# Patient Record
Sex: Male | Born: 1985 | Race: Black or African American | Hispanic: No | Marital: Single | Smoking: Never smoker
Health system: Southern US, Community
[De-identification: ages and names within clinical notes are randomized; demographics above are authoritative.]

---

## 2016-03-02 ENCOUNTER — Encounter (HOSPITAL_COMMUNITY): Payer: Self-pay

## 2016-03-02 ENCOUNTER — Emergency Department (HOSPITAL_COMMUNITY)
Admission: EM | Admit: 2016-03-02 | Discharge: 2016-03-02 | Disposition: A | Discharge status: Home / Self Care | Payer: Self-pay | Attending: Emergency Medicine | Admitting: Emergency Medicine

## 2016-03-02 DIAGNOSIS — R55 Syncope and collapse: Secondary | ICD-10-CM | POA: Insufficient documentation

## 2016-03-02 LAB — CBC WITH DIFFERENTIAL/PLATELET
Basophils Absolute: 0 10*3/uL (ref 0.0–0.1)
Basophils Relative: 0 %
Eosinophils Absolute: 0.1 10*3/uL (ref 0.0–0.7)
Eosinophils Relative: 1 %
HCT: 43.1 % (ref 39.0–52.0)
Hemoglobin: 15.2 g/dL (ref 13.0–17.0)
Lymphocytes Relative: 32 %
Lymphs Abs: 2.4 10*3/uL (ref 0.7–4.0)
MCH: 31 pg (ref 26.0–34.0)
MCHC: 35.3 g/dL (ref 30.0–36.0)
MCV: 88 fL (ref 78.0–100.0)
Monocytes Absolute: 0.4 10*3/uL (ref 0.1–1.0)
Monocytes Relative: 5 %
Neutro Abs: 4.8 10*3/uL (ref 1.7–7.7)
Neutrophils Relative %: 62 %
Platelets: 226 10*3/uL (ref 150–400)
RBC: 4.9 MIL/uL (ref 4.22–5.81)
RDW: 12.2 % (ref 11.5–15.5)
WBC: 7.7 10*3/uL (ref 4.0–10.5)

## 2016-03-02 LAB — BASIC METABOLIC PANEL
Anion gap: 12 (ref 5–15)
BUN: 10 mg/dL (ref 6–20)
CO2: 26 mmol/L (ref 22–32)
Calcium: 9.6 mg/dL (ref 8.9–10.3)
Chloride: 99 mmol/L — ABNORMAL LOW (ref 101–111)
Creatinine, Ser: 1.34 mg/dL — ABNORMAL HIGH (ref 0.61–1.24)
GFR calc Af Amer: >60 mL/min (ref >60)
GFR calc non Af Amer: >60 mL/min (ref >60)
Glucose, Bld: 122 mg/dL — ABNORMAL HIGH (ref 65–99)
Potassium: 3.8 mmol/L (ref 3.5–5.1)
Sodium: 137 mmol/L (ref 135–145)

## 2016-03-02 LAB — D-DIMER, QUANTITATIVE (NOT AT ARMC): D-Dimer, Quant: 0.32 ug/mL-FEU (ref 0.00–0.50)

## 2016-03-02 MED ORDER — SODIUM CHLORIDE 0.9 % IV BOLUS (SEPSIS)
1000.0000 mL | Freq: Once | INTRAVENOUS | Status: AC
  Administered 2016-03-02: 1000 mL via INTRAVENOUS

## 2016-03-02 NOTE — ED Provider Notes (Signed)
[facility]-EMERGENCY DEPT Provider Note   CSN: [id number] Arrival date & time: 03/02/16  2105     History   Chief Complaint Chief Complaint  Patient presents with  . Loss of Consciousness    HPI Tyler Norman is a 31 y.o. male who is previously healthy who presents following near syncopal episode after eating dinner this evening. Patient states he was at [employer] eating some vegetables and then grilled catfish followed by fried catfish when he began feeling hot, sweating, and lightheaded. Patient states he put his head on the table until EMS arrived. He never passed out. He denies any abdominal pain, nausea, vomiting at any point. He denies any chest pain, shortness of breath, current lightheadedness or headache, urinary symptoms. Patient states he was up around 6 AM this morning and only got around 3 hours of sleep last night. He drove to [place] and back and was in the car around 8 hours total. He denies any new leg pain or swelling. He does not smoke. Patient notes he has drank very little fluid today.  HPI  History reviewed. No pertinent past medical history.  There are no active problems to display for this patient.   History reviewed. No pertinent surgical history.     Home Medications    Prior to Admission medications   Medication Sig Start Date End Date Taking? Authorizing Provider  loratadine (CLARITIN) 10 MG tablet Take 10 mg by mouth daily as needed for allergies.   Yes Historical Provider, MD    Family History No family history on file.  Social History Social History  Substance Use Topics  . Smoking status: Never Smoker  . Smokeless tobacco: Never Used  . Alcohol use Yes     Comment: "every blue moon"     Allergies   Patient has no allergy information on record.   Review of Systems Review of Systems  Constitutional: Positive for diaphoresis and fatigue. Negative for chills and fever.  HENT: Negative for facial swelling and sore throat.     Respiratory: Negative for shortness of breath.   Cardiovascular: Negative for chest pain.  Gastrointestinal: Negative for abdominal pain, nausea and vomiting.  Genitourinary: Negative for dysuria.  Musculoskeletal: Negative for back pain.  Skin: Negative for rash and wound.  Neurological: Positive for light-headedness. Negative for headaches.  Psychiatric/Behavioral: The patient is not nervous/anxious.      Physical Exam Updated Vital Signs BP 128/79   Pulse 82   Temp 98.2 F (36.8 C) (Oral)   Resp 16   Ht 5\' 9"  (1.753 m)   Wt 78 kg   SpO2 99%   BMI 25.40 kg/m   Physical Exam  Constitutional: He appears well-developed and well-nourished. No distress.  HENT:  Head: Normocephalic and atraumatic.  Mouth/Throat: Oropharynx is clear and moist. No oropharyngeal exudate.  Eyes: Conjunctivae and EOM are normal. Pupils are equal, round, and reactive to light. Right eye exhibits no discharge. Left eye exhibits no discharge. No scleral icterus.  Neck: Normal range of motion. Neck supple. No thyromegaly present.  Cardiovascular: Normal rate, regular rhythm, normal heart sounds and intact distal pulses.  Exam reveals no gallop and no friction rub.   No murmur heard. Pulmonary/Chest: Effort normal and breath sounds normal. No stridor. No respiratory distress. He has no wheezes. He has no rales.  Abdominal: Soft. Bowel sounds are normal. He exhibits no distension. There is no tenderness. There is no rebound and no guarding.  Musculoskeletal: He exhibits no edema.  No calf  TTP bilaterally  Lymphadenopathy:    He has no cervical adenopathy.  Neurological: He is alert. Coordination normal.  CN 3-12 intact; normal sensation throughout; 5/5 strength in all 4 extremities; equal bilateral grip strength; no ataxia on finger to nose  Skin: Skin is warm and dry. No rash noted. He is not diaphoretic. No pallor.  Psychiatric: He has a normal mood and affect.  Nursing note and vitals  reviewed.    ED Treatments / Results  Labs (all labs ordered are listed, but only abnormal results are displayed) Labs Reviewed  BASIC METABOLIC PANEL - Abnormal; Notable for the following:       Result Value   Chloride 99 (*)    Glucose, Bld 122 (*)    Creatinine, Ser 1.34 (*)    All other components within normal limits  CBC WITH DIFFERENTIAL/PLATELET  D-DIMER, QUANTITATIVE (NOT AT [facility])    EKG  EKG Interpretation  Date/Time:  Thursday March 02 2016 21:21:44 EST Ventricular Rate:  79 PR Interval:    QRS Duration: 97 QT Interval:  369 QTC Calculation: 423 R Axis:   75 Text Interpretation:  Sinus rhythm ST elev, probable normal early repol pattern Confirmed by Rubin PayorPICKERING  MD, Harrold DonathNATHAN [phone]([id number]) on 03/02/2016 10:29:38 PM       Radiology No results found.  Procedures Procedures (including critical care time)  Medications Ordered in ED Medications  sodium chloride 0.9 % bolus 1,000 mL (1,000 mLs Intravenous New Bag/Given 03/02/16 2152)     Initial Impression / Assessment and Plan / ED Course  I have reviewed the triage vital signs and the nursing notes.  Pertinent labs & imaging results that were available during my care of the patient were reviewed by me and considered in my medical decision making (see chart for details).     Patient feeling better after fluids in the ED. CBC unremarkable. BMP shows 99 chloride, glucose 122, creatinine 1.34. D-dimer 0.32. EKG shows NSR. Probable dehydration or vasovagal. Patient given a work note to rest tomorrow and advised to drink plan fluids. Strict precautions given. Patient advised to follow-up to establish care with a primary care provider. Patient understands and agrees with plan. Patient vitals stable throughout the course and discharged in satisfactory condition. I discussed patient's case with Dr. Rubin PayorPickering who guided the patient's management and agrees with plan.  Final Clinical Impressions(s) / ED Diagnoses   Final  diagnoses:  Near syncope    New Prescriptions New Prescriptions   No medications on file     Emi Holeslexandra M Reeta Kuk, Cordelia Poche-C 03/02/16 2312    Benjiman CoreNathan Pickering, MD 03/03/16 0002

## 2016-03-02 NOTE — ED Triage Notes (Signed)
Pt BIB [facility] from [employer] where pt was finishing eating and had an episode of extreme tiredness. EMS reports that the call was for someone unconscious. Upon arrival EMS reports pt was grey from shoulders up but had good color shoulders down. Pt reports only having about 3 hours of sleep last night and long car ride today. Denies ETOH, or any other substances. Presents A&OX4

## 2016-03-02 NOTE — Discharge Instructions (Signed)
Make sure to drink plenty of fluids. Please follow-up and establish care with a primary care provider by calling the number circled on your discharge paperwork. Please return to the emergency department if you develop any new or worsening symptoms.

## 2017-10-28 ENCOUNTER — Other Ambulatory Visit: Payer: Self-pay

## 2017-10-28 ENCOUNTER — Emergency Department (HOSPITAL_COMMUNITY): Payer: Self-pay

## 2017-10-28 ENCOUNTER — Emergency Department (HOSPITAL_COMMUNITY)
Admission: EM | Admit: 2017-10-28 | Discharge: 2017-10-29 | Disposition: A | Discharge status: Home / Self Care | Payer: Self-pay | Attending: Emergency Medicine | Admitting: Emergency Medicine

## 2017-10-28 ENCOUNTER — Encounter (HOSPITAL_COMMUNITY): Payer: Self-pay | Admitting: Emergency Medicine

## 2017-10-28 DIAGNOSIS — R079 Chest pain, unspecified: Secondary | ICD-10-CM | POA: Insufficient documentation

## 2017-10-28 DIAGNOSIS — Z79899 Other long term (current) drug therapy: Secondary | ICD-10-CM | POA: Insufficient documentation

## 2017-10-28 LAB — CBC
HEMATOCRIT: 44 % (ref 39.0–52.0)
Hemoglobin: 15.3 g/dL (ref 13.0–17.0)
MCH: 31.1 pg (ref 26.0–34.0)
MCHC: 34.8 g/dL (ref 30.0–36.0)
MCV: 89.4 fL (ref 80.0–100.0)
Platelets: 278 10*3/uL (ref 150–400)
RBC: 4.92 MIL/uL (ref 4.22–5.81)
RDW: 11.8 % (ref 11.5–15.5)
WBC: 6.7 10*3/uL (ref 4.0–10.5)
nRBC: 0 % (ref 0.0–0.2)

## 2017-10-28 LAB — BASIC METABOLIC PANEL
ANION GAP: 7 (ref 5–15)
BUN: 11 mg/dL (ref 6–20)
CALCIUM: 9.3 mg/dL (ref 8.9–10.3)
CO2: 26 mmol/L (ref 22–32)
Chloride: 104 mmol/L (ref 98–111)
Creatinine, Ser: 1.24 mg/dL (ref 0.61–1.24)
GFR calc Af Amer: >60 mL/min (ref >60)
Glucose, Bld: 125 mg/dL — ABNORMAL HIGH (ref 70–99)
POTASSIUM: 3.6 mmol/L (ref 3.5–5.1)
Sodium: 137 mmol/L (ref 135–145)

## 2017-10-28 LAB — I-STAT TROPONIN, ED: TROPONIN I, POC: 0 ng/mL (ref 0.00–0.08)

## 2017-10-28 NOTE — ED Triage Notes (Signed)
Pt states he was at a restaurant and started having 10/10 L sided chest pain, feeling faint, generalized weakness, and diaphoresis that started 10 min after eating a steak dinner.  Denies pain at present.  Denies SOB.

## 2017-10-29 LAB — I-STAT TROPONIN, ED: TROPONIN I, POC: 0 ng/mL (ref 0.00–0.08)

## 2017-10-29 NOTE — ED Provider Notes (Signed)
[facility] EMERGENCY DEPARTMENT Provider Note   CSN: [id number] Arrival date & time: 10/28/17  2220     History   Chief Complaint Chief Complaint  Patient presents with  . Chest Pain    HPI Tyler Norman is a 32 y.o. male.  The history is provided by the patient and medical records. No language interpreter was used.   Tyler Norman is an otherwise healthy 32 y.o. male who presents to the emergency department today for acute onset of central chest pain across the entire chest wall which began about 10:00 PM tonight.  He states that he went out for dinner and shortly after leaving, his pain developed.  Denies any exertional component.  Associated with sudden sweating. He reports similar pain with a sudden sweating back in March 2018 where he came to the ER and they told him this was likely secondary to something that he ate.  He never had any shortness of breath, abdominal pain, nausea, vomiting, back pain.  No medications taken prior to arrival for symptoms.  He states that the pain lasted 10 to 15 minutes and then resolved.  He has not experienced any pain or other symptoms since the initial onset of his symptoms.  He currently is symptom-free and without complaints.   History reviewed. No pertinent past medical history.  There are no active problems to display for this patient.   History reviewed. No pertinent surgical history.      Home Medications    Prior to Admission medications   Medication Sig Start Date End Date Taking? Authorizing Provider  loratadine (CLARITIN) 10 MG tablet Take 10 mg by mouth daily as needed for allergies.    [provider]    Family History No family history on file.  Social History Social History   Tobacco Use  . Smoking status: Never Smoker  . Smokeless tobacco: Never Used  Substance Use Topics  . Alcohol use: Yes    Comment: "every blue moon"  . Drug use: No     Allergies   Patient has no allergy  information on record.   Review of Systems Review of Systems  Constitutional: Positive for diaphoresis.  Cardiovascular: Positive for chest pain. Negative for palpitations and leg swelling.  All other systems reviewed and are negative.    Physical Exam Updated Vital Signs BP 120/82 (BP Location: Right Arm)   Pulse 86   Temp 98.1 F (36.7 C) (Oral)   Resp 18   SpO2 100%   Physical Exam  Constitutional: He is oriented to person, place, and time. He appears well-developed and well-nourished. No distress.  HENT:  Head: Normocephalic and atraumatic.  Neck: Neck supple. No JVD present.  Cardiovascular: Normal rate, regular rhythm and normal heart sounds.  No murmur heard. Pulmonary/Chest: Effort normal and breath sounds normal. No respiratory distress.  Abdominal: Soft. He exhibits no distension. There is no tenderness.  Musculoskeletal: He exhibits no edema.  Neurological: He is alert and oriented to person, place, and time.  Skin: Skin is warm and dry.  Nursing note and vitals reviewed.    ED Treatments / Results  Labs (all labs ordered are listed, but only abnormal results are displayed) Labs Reviewed  BASIC METABOLIC PANEL - Abnormal; Notable for the following components:      Result Value   Glucose, Bld 125 (*)    All other components within normal limits  CBC  I-STAT TROPONIN, ED    EKG None   ED ECG  REPORT   Date: 10/29/2017  Rate: 86  Rhythm: normal sinus rhythm  QRS Axis: normal  Intervals: normal  ST/T Wave abnormalities: normal  Conduction Disutrbances:none  Narrative Interpretation:   Old EKG Reviewed: none available  I have personally reviewed the EKG tracing with attending, Dr. Bebe Shaggy and we agree with the computerized printout as noted.   Radiology Dg Chest 2 View  Result Date: 10/28/2017 CLINICAL DATA:  Left-sided chest pain tonight with generalized weakness and diaphoresis after eating dinner. EXAM: CHEST - 2 VIEW COMPARISON:  None.  FINDINGS: Lungs are adequately inflated without focal airspace consolidation or effusion. Cardiomediastinal silhouette, bones and soft tissues are within normal. IMPRESSION: No active cardiopulmonary disease. Electronically Signed   By: Elberta Fortis M.D.   On: 10/28/2017 23:18    Procedures Procedures (including critical care time)  Medications Ordered in ED Medications - No data to display   Initial Impression / Assessment and Plan / ED Course  I have reviewed the triage vital signs and the nursing notes.  Pertinent labs & imaging results that were available during my care of the patient were reviewed by me and considered in my medical decision making (see chart for details).    Tyler Norman is a 32 y.o. male who presents to ED for chest pain associated with sudden sweating which occurred after he ate dinner tonight. No exertional component. This lasted about 10-15 minutes then resolved. He reports similar pain with a sudden sweating back in March 2018 where he came to the ER and they told him this was likely secondary to something that he ate. On exam, patient is afebrile, hemodynamically stable with normal cardiopulmonary exam.  He is currently chest pain free.  He has had no symptoms since the 10 to 15 minutes of initial pain.    Labs reviewed and reassuring with negative troponin CXR with no acute abnormalities.  EKG NSR  PERC negative Heart score of 1   Awaiting second troponin at shift change. Care assumed by PA Sanders. Case discussed, plan agreed upon. Will follow up on 2nd troponin. If negative, likely discharge to home.    Final Clinical Impressions(s) / ED Diagnoses   Final diagnoses:  Chest pain with low risk for cardiac etiology    ED Discharge Orders    None       Ward, Chase Picket, PA-C 10/29/17 [id number]    Zadie Rhine, MD 10/29/17 [phone]

## 2017-10-29 NOTE — Discharge Instructions (Signed)
It was my pleasure taking care of you today!   You were seen in the Emergency Department today for chest pain.  As we have discussed, todays blood work and imaging are normal, but you may require further testing.  Please call your primary care physician to schedule a follow up appointment to discuss your ER visit today. If you do not have a primary doctor, please see the information below.   Return to the Emergency Department if you experience any further chest pain/pressure/tightness, difficulty breathing, sudden sweating, or other symptoms that concern you.  To find a primary care or specialty doctor please call [phone] or [phone] to access "[place] Find a Doctor Service."  You may also go on the [facility] website at [email or web address]  There are also multiple [facility] and [facility] practices throughout the [place] that are frequently accepting new patients. You may find a clinic that is close to your home and contact them.  [facility] - [address] [place] [address] [phone]  [facility] in [place] (also locations in [place] and [place]) - [address] [employer] [place] [phone]  [facility] Department - [address] [place] [phone]

## 2018-03-06 ENCOUNTER — Ambulatory Visit (HOSPITAL_COMMUNITY)
Admission: EM | Admit: 2018-03-06 | Discharge: 2018-03-06 | Disposition: A | Discharge status: Home / Self Care | Payer: Self-pay | Attending: Internal Medicine | Admitting: Internal Medicine

## 2018-03-06 ENCOUNTER — Encounter (HOSPITAL_COMMUNITY): Payer: Self-pay | Admitting: Emergency Medicine

## 2018-03-06 DIAGNOSIS — R6889 Other general symptoms and signs: Secondary | ICD-10-CM

## 2018-03-06 MED ORDER — OSELTAMIVIR PHOSPHATE 75 MG PO CAPS: 75.0000 mg | ORAL_CAPSULE | Freq: Two times a day (BID) | ORAL | 0 refills | Status: AC

## 2018-03-06 NOTE — ED Triage Notes (Signed)
Pt c/o flu like symptmos, fever, body aches, since Monday.

## 2020-02-26 IMAGING — CR DG CHEST 2V
2 series · 2 of 2 positions shown · non-contrast
Comparison: None.

CLINICAL DATA: Left-sided chest pain tonight with generalized
weakness and diaphoresis after eating dinner.

EXAM:
CHEST - 2 VIEW

[chest pa]
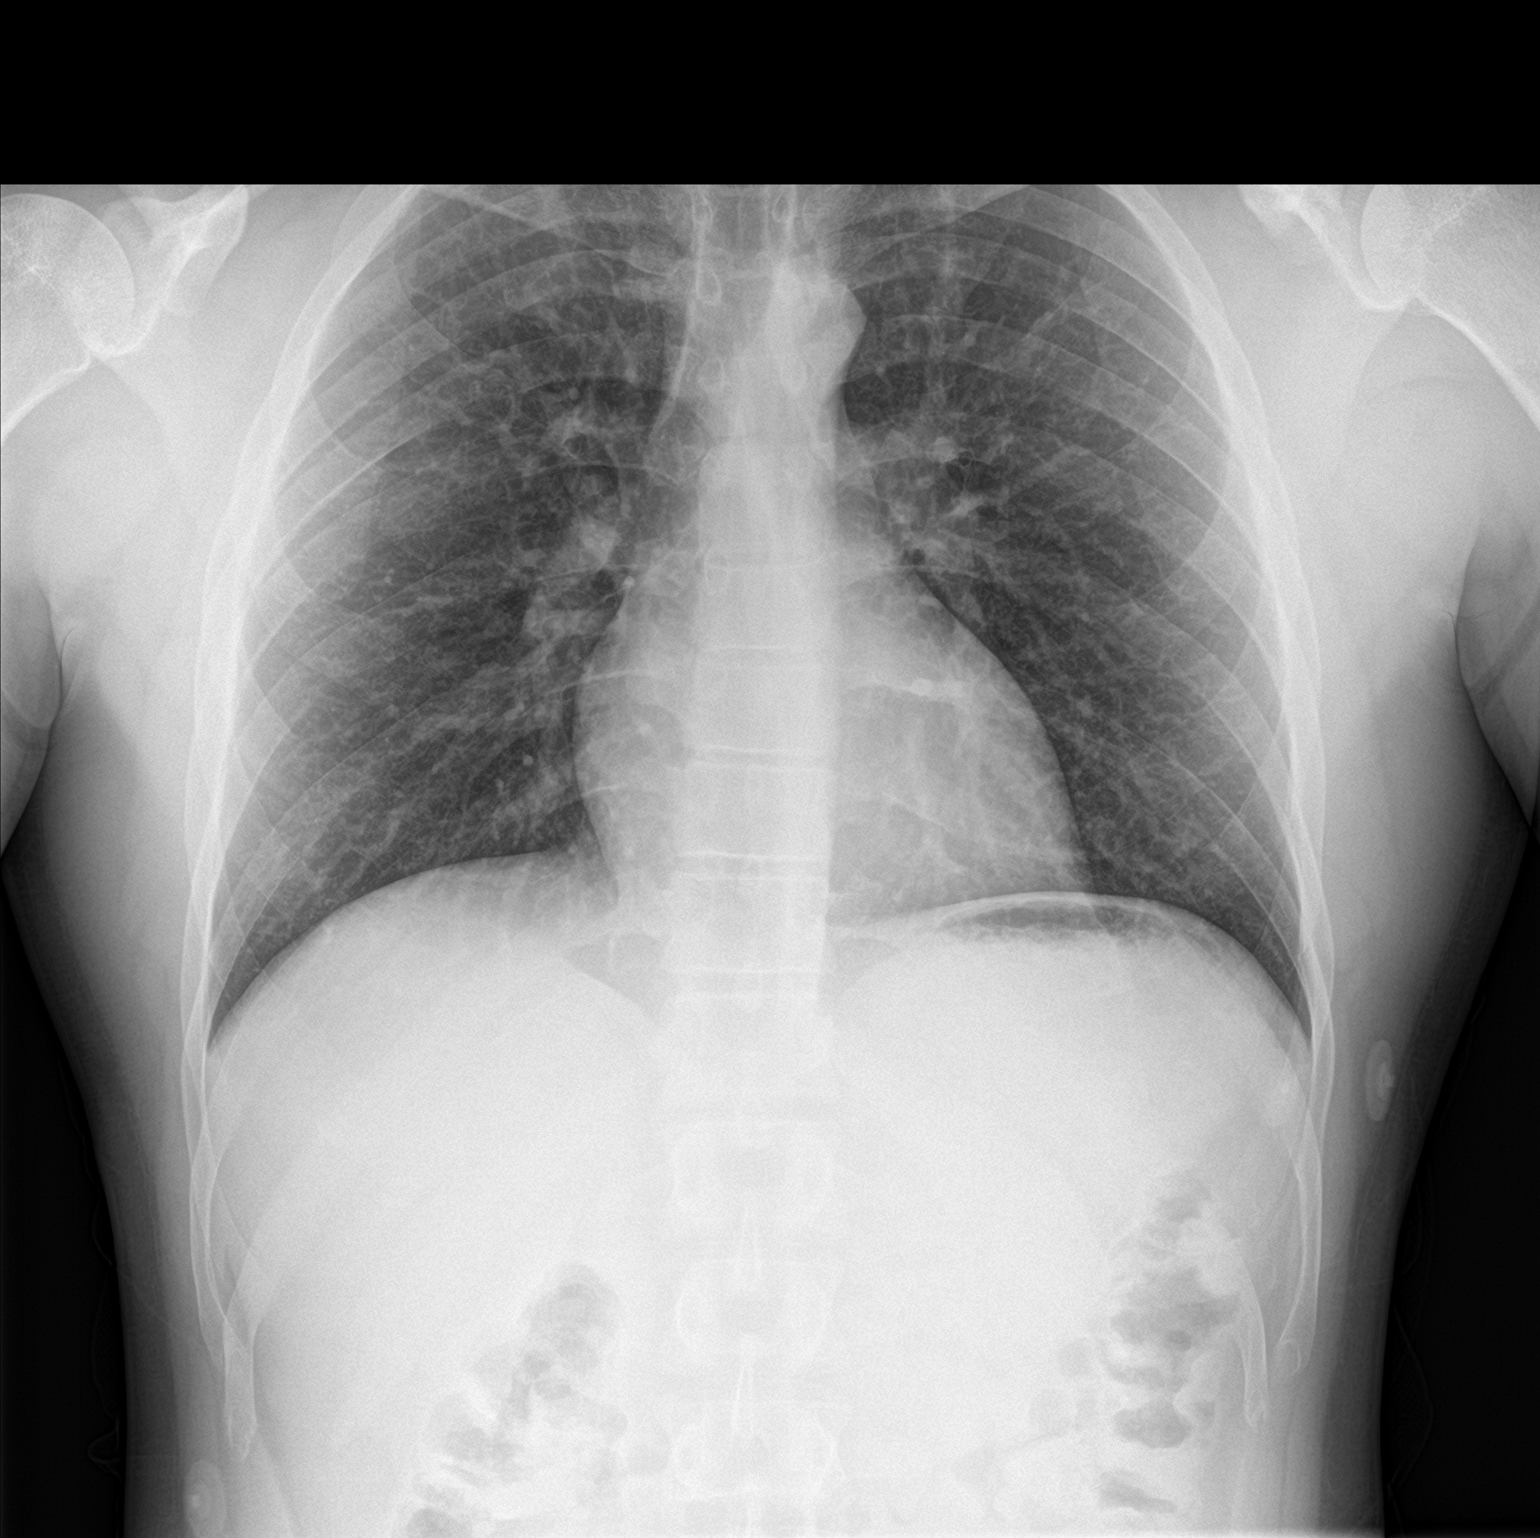

[chest lat]
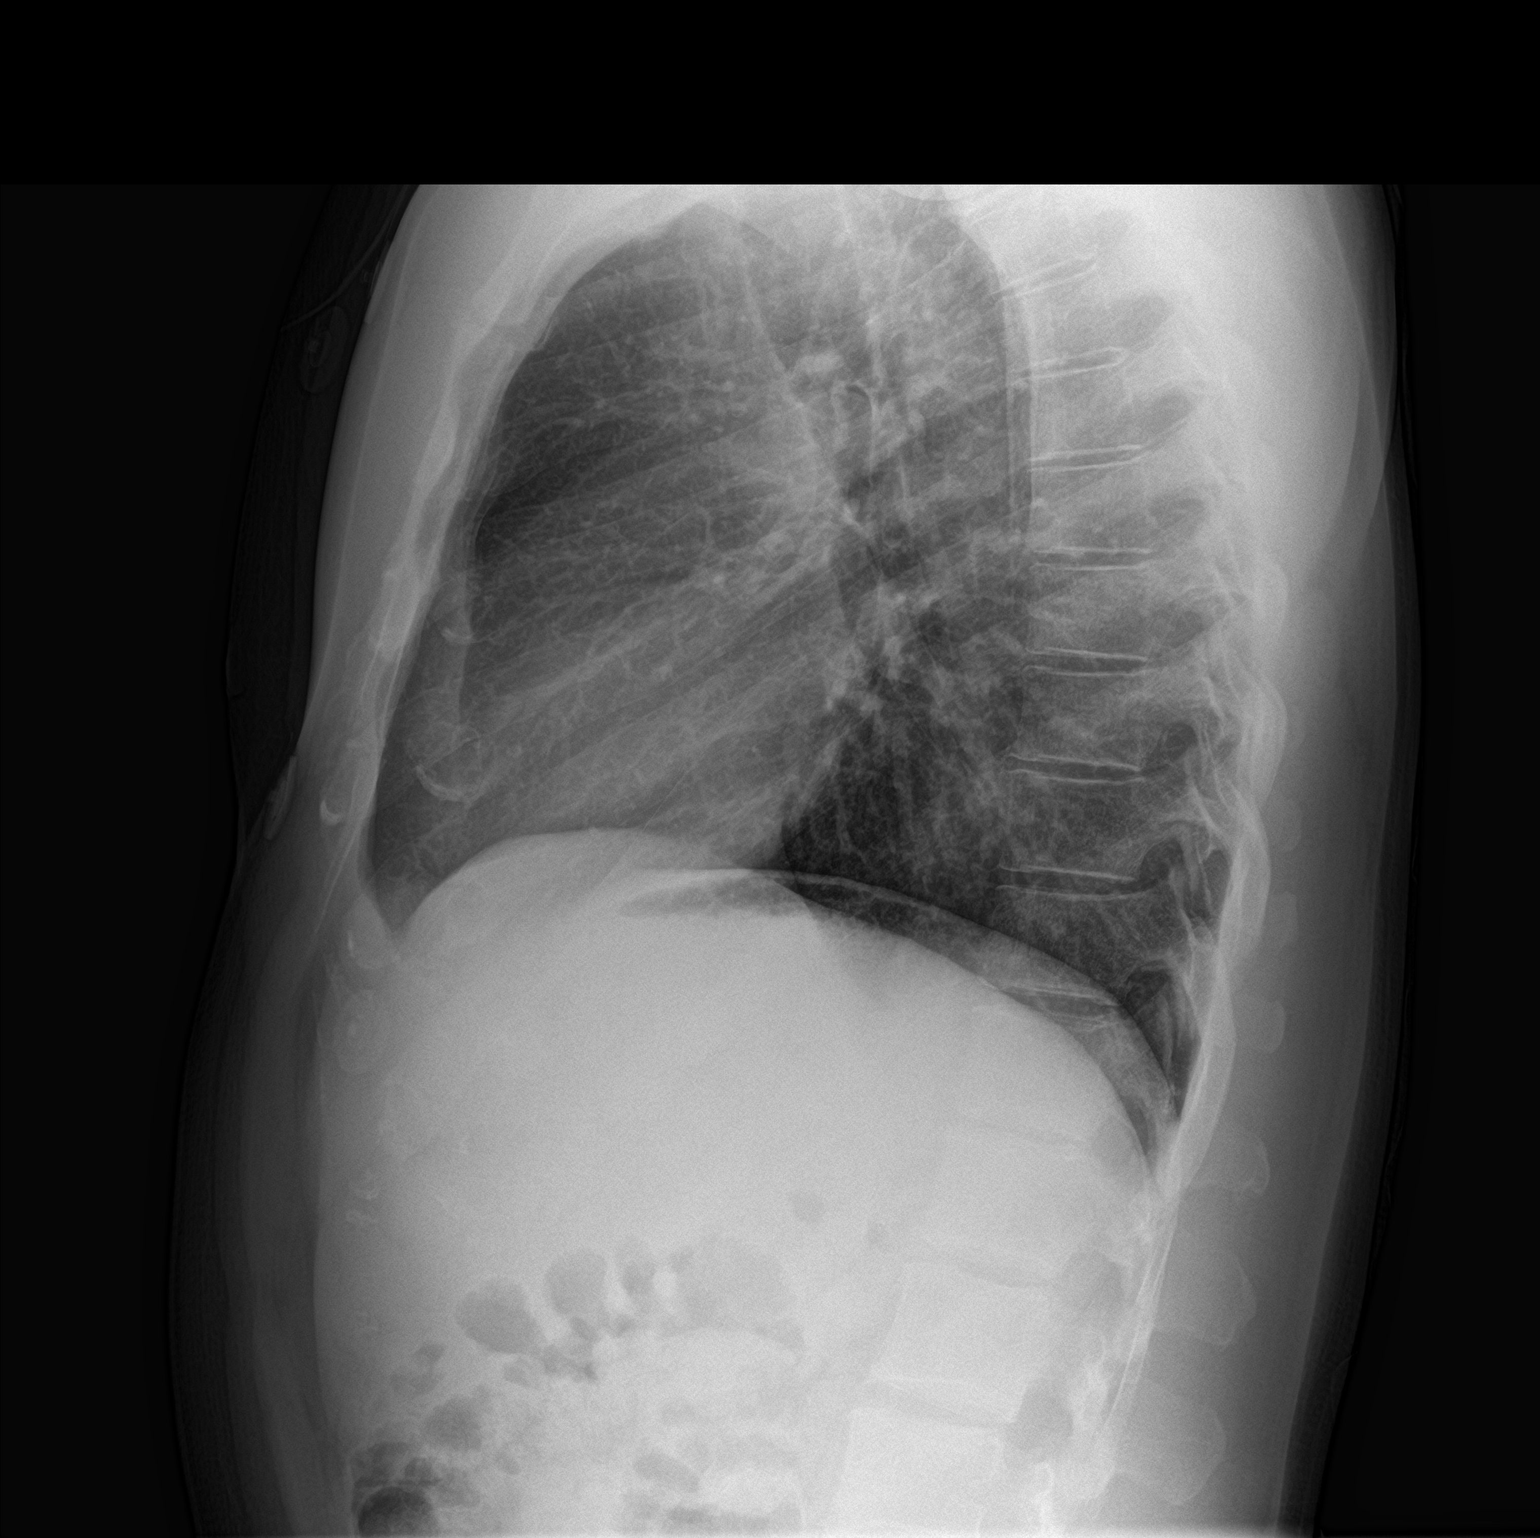

[2 of 2 positions shown; findings below may reference images not displayed]

FINDINGS: Lungs are adequately inflated without focal airspace consolidation
or effusion. Cardiomediastinal silhouette, bones and soft tissues
are within normal.
IMPRESSION: No active cardiopulmonary disease.
